# Patient Record
Sex: Female | Born: 1945 | Race: Black or African American | Hispanic: No
Health system: Southern US, Community
[De-identification: ages and names within clinical notes are randomized; demographics above are authoritative.]

## PROBLEM LIST (undated history)

## (undated) DIAGNOSIS — E7800 Pure hypercholesterolemia, unspecified: Secondary | ICD-10-CM

## (undated) DIAGNOSIS — I10 Essential (primary) hypertension: Secondary | ICD-10-CM

## (undated) DIAGNOSIS — I219 Acute myocardial infarction, unspecified: Secondary | ICD-10-CM

---

## 2001-05-18 ENCOUNTER — Encounter: Payer: Self-pay | Admitting: Family Medicine

## 2001-05-18 ENCOUNTER — Encounter: Admission: RE | Admit: 2001-05-18 | Discharge: 2001-05-18 | Payer: Self-pay | Admitting: Family Medicine

## 2001-06-15 ENCOUNTER — Inpatient Hospital Stay (HOSPITAL_COMMUNITY): Admission: EM | Admit: 2001-06-15 | Discharge: 2001-06-21 | Payer: Self-pay | Admitting: *Deleted

## 2001-07-06 ENCOUNTER — Encounter (HOSPITAL_COMMUNITY): Admission: RE | Admit: 2001-07-06 | Discharge: 2001-10-04 | Payer: Self-pay | Admitting: Cardiology

## 2001-07-21 ENCOUNTER — Emergency Department (HOSPITAL_COMMUNITY): Admission: EM | Admit: 2001-07-21 | Discharge: 2001-07-21 | Payer: Self-pay | Admitting: Emergency Medicine

## 2001-07-23 ENCOUNTER — Ambulatory Visit (HOSPITAL_COMMUNITY): Admission: RE | Admit: 2001-07-23 | Discharge: 2001-07-23 | Payer: Self-pay | Admitting: Cardiology

## 2001-07-30 ENCOUNTER — Ambulatory Visit (HOSPITAL_COMMUNITY): Admission: RE | Admit: 2001-07-30 | Discharge: 2001-07-30 | Payer: Self-pay | Admitting: Cardiology

## 2001-07-30 ENCOUNTER — Encounter: Payer: Self-pay | Admitting: Cardiology

## 2001-10-05 ENCOUNTER — Encounter (HOSPITAL_COMMUNITY): Admission: RE | Admit: 2001-10-05 | Discharge: 2001-10-20 | Payer: Self-pay | Admitting: Cardiology

## 2003-01-18 ENCOUNTER — Ambulatory Visit (HOSPITAL_COMMUNITY): Admission: RE | Admit: 2003-01-18 | Discharge: 2003-01-18 | Payer: Self-pay | Admitting: Cardiology

## 2003-01-18 ENCOUNTER — Encounter: Payer: Self-pay | Admitting: Cardiology

## 2003-12-08 ENCOUNTER — Ambulatory Visit (HOSPITAL_COMMUNITY): Admission: RE | Admit: 2003-12-08 | Discharge: 2003-12-08 | Payer: Self-pay | Admitting: Cardiology

## 2008-09-20 ENCOUNTER — Ambulatory Visit (HOSPITAL_COMMUNITY): Admission: RE | Admit: 2008-09-20 | Discharge: 2008-09-20 | Payer: Self-pay | Admitting: Obstetrics

## 2010-11-29 NOTE — Cardiovascular Report (Signed)
[place]  Patient:    Heather Larsen, Heather Larsen Visit Number: [id number] MRN: [id number]          Service Type: MED Location: 2000 2024 01 Attending Physician:  Robynn Pane Dictated by:   Eduardo Osier Sharyn Lull, M.D. Proc. Date: 06/15/01 Admit Date:  06/15/2001                          Cardiac Catheterization  PROCEDURES: 1. Left and right cardiac catheterization with selective right and left    coronary angiography, left ventriculography via right groin ______ . 2. Successful percutaneous transluminal coronary angioplasty to mid    left anterior descending using 2.5 x 13.5 mm, long CrossSail balloon. 3. Successful deployment of 2.5 x 13.3 mm, long Bx Velocity stent in the mid    left anterior descending.  INDICATIONS FOR PROCEDURE: The patient is a 65 year old, white female, ______ Dictated by:   Eduardo Osier. Sharyn Lull, M.D. Attending Physician:  Robynn Pane DD:  06/21/01 TD:  06/21/01 Job: [id number]

## 2010-11-29 NOTE — Cardiovascular Report (Signed)
[place]. [facility]  Patient:    Heather Larsen, Heather Larsen Visit Number: [id number] MRN: [id number]          Service Type: EMS Location: [place] Attending Physician:  Nelia Shi Dictated by:   Eduardo Osier Sharyn Lull, M.D. Proc. Date: 09/22/01 Admit Date:  07/21/2001 Discharge Date: 07/21/2001   CC:         Cath Lab   Cardiac Catheterization  PROCEDURES PERFORMED: 1. Left ventriculography via groin using Judkins technique. 2. Selective left and right coronary angiography. 3. Successful percutaneous transluminal coronary angioplasty to mid    left anterior descending coronary artery done using 2.5 x 15 mm    long CrossSail balloon. 4. Successful deployment of 2.5 x 13 mm long BX Velocity stent    deployed in mid left anterior descending coronary artery.  INDICATIONS:  The patient is a 65 year old black female with past medical history significant for hypertension, history of GERD, lactose intolerance, allergic rhinitis. She same to the ER complaining of retrosternal chest pressures since 7:00 a.m. after eating breakfast associated with nausea and vomiting x4 and mild shortness of breath. The patient waited all day and thought it was acid reflux and then called PMD in afternoon and was advised to go to ER. States pain was grade 5/10 and now 3/10. EKG done in the ER showed Q-waves in V1-V3 with ST elevation and ST-T wave changes in anterolateral leads suggestive of evolving anteroseptal wall MI. The patient denies prior episodes of such pain. Denies PND, orthopnea, leg swelling, denies palpitations, lightheadedness or syncope.  PAST MEDICAL HISTORY:  As above.  PAST SURGICAL HISTORY:  She had ovarian cyst resection about 27 years ago.  HOME MEDICATIONS: 1. Adalat CC 60 mg p.o.q.d. 2. Claritin. 3. Prempro.  ALLERGIES:  No known drug allergies.  SOCIAL HISTORY:  She is widowed, has one son. No history of smoking or alcohol abuse. Works for [employer] and  born in [place].  FAMILY HISTORY:  Father died of MI at the age of 18, mother died of lung cancer at age of 75.  PHYSICAL EXAMINATION:  VITAL SIGNS:  On exam she was awake, alert and oriented times three. Blood pressure was 150/84, pulse was 65.  HEENT:  Conjunctivae was pink.  NECK:  Supple, no JVD, no bruit.  LUNGS:  Clear to auscultation without rhonchi or rails.  CARDIOVASCULAR:  S1 and S2 was normal. There was a soft S4 gallop.  ABDOMEN:  Soft, bowel sounds are present, nontender.  EXTREMITIES:  There is no cyanosis, clubbing, or edema.  LABORATORY AND ACCESSORY DATA:  EKG showed normal sinus rhythm with evolving anteroseptal wall MI. Her first set of CPK was 84, MB of 172.  INDICATIONS:  Discussed with patient regarding possible emergency left catheterization, possible PTCA and stenting, and its risks, i.e., MI, stroke, need for emergency CABG, risk of restenosis, heart failure, arrhythmias, local anesthetic complications, etc. and patient consented for PCI.  DESCRIPTION OF PROCEDURE:  After obtaining informed consent, the patient was brought to the Cath Lab and was placed on fluoroscopy table. The right groin was prepped and draped in usual fashion. Xylocaine 2% was used for local anesthesia in the right groin. With the help of thin walled needle, a #7 [place] arterial and #6 French venous sheaths are placed. Both the sheath are spreaded and flushed. Next, a #6 [place] left Judkins catheter was advanced over the wire under fluoroscopic guidance up to the ascending aorta, the wire was pulled out, the catheter  was aspirated and connected to the manifold. The catheter was further advanced and engaged into the left coronary ostium. Multiple views of the left system were taken. Next, the catheter was disengaged and was pulled out over the wire and was replaced with #6 [place] right Judkins catheter which was advanced over the wire under fluoroscopic guidance up to the  ascending aorta. The wire was pulled out, the catheter was aspirated and connected to the manifold. The catheter was further advanced and engaged into the right coronary ostium. Multiple views of the right system were taken. Next, the catheter was disengaged and was pulled out over the wire and was replaced with #6 French pigtail catheter which was advanced over the wire under fluoroscopic guidance up to the ascending aorta. The wire was pulled out, the catheter was aspirated and connected to the manifold. The catheter was further advanced across the aortic valve into the left ventricle. Left ventricular pressures were recorded. Next, angiography was done in 30 degree RAO position, postangiographic pressures was recorded from the left ventricle and then pulled back and pressures were recorded from the aorta. There was no gradient across the aortic valve. Next, the pigtail catheter was pulled out over the wire, the sheaths were aspirated and flushed.  FINDINGS:  CORONARY ANGIOGRAPHY:  Severe anterolateral wall hypokinesia. Ejection fraction of 45-50%. There was 2+ MR. The left main was patent, the LAD was 100% occluded proximally after giving off large diagonal 2; diagonal 1 was very small which was patent. The circumflex was small which tapers down in AV groove after giving off OM2; OM11 was medium sized which was patent, OM2 was very small which was patent. The RCA was patent.  INTERVENTIONAL PROCEDURES:  Successful PTCA to 100% occluded mid LAD done using 2.5 x 15 mm long CrossSail balloon for predilation and then 2.5 x 13 mm long BX Velocity stent was deployed at 10 atmospheres of pressure which was fully expanded going up to 13 atmospheres of pressures. The lesion was dilated from 100% to 0% residual with excellent TIMI grade III distal flow without evidence of dissection or distal embolization.  The patient received intermittent heparin and Reopro during the procedure.  The  patient received aspirin and 300 mg of Plavix in ER. The patient tolerated the procedure well. There were no complications. The patient was transferred to CCU in stable condition. Dictated by:   Eduardo Osier Sharyn Lull, M.D. Attending Physician:  Nelia Shi DD:  09/23/01 TD:  09/24/01 Job: [id number]

## 2010-11-29 NOTE — Discharge Summary (Signed)
. [facility]  Patient:    Heather Larsen, Heather Larsen Visit Number: [id number] MRN: [id number]          Service Type: EMS Location: [place] Attending Physician:  Nelia Shi Dictated by:   Eduardo Osier Sharyn Lull, M.D. Admit Date:  07/21/2001 Discharge Date: 07/21/2001                             Discharge Summary  ADMISSION DIAGNOSES: 1. Evolving anteroseptal myocardial infarction. 2. Hypertension. 3. Gastroesophageal reflux disease. 4. History of allergic rhinitis.  FINAL DIAGNOSES: 1. Status post anteroseptal myocardial infarction status post percutaneous    transluminal coronary angioplasty and stenting to proximal left anterior    descending artery. 2. Hypertension. 3. Gastroesophageal reflux disease. 4. History of allergic rhinitis. 5. Anemia secondary to hydration and blood loss during the procedure. 6. Status post nonsustained ventricular tachycardia.  DISCHARGE MEDICATIONS: 1. Enteric-coated aspirin 325 mg 1 tablet daily. 2. Plavix 75 mg 1 tablet daily with food. 3. Toprol XL 25 mg 1 tablet daily. 4. Altace 2.5 mg 1 capsule daily. 5. Nitrostat 0.4 mg sublingually, use as directed.  ACTIVITY:  Avoid heavy lifting, pushing, or pulling.  DIET:  Low salt, low cholesterol.  DISCHARGE INSTRUCTIONS:  Angioplasty and stent instructions have been given. The patient will be scheduled for phase 2 cardiac rehabilitation as outpatient.  Follow up with me in one week.  CONDITION UPON DISCHARGE: Stable.  BRIEF HISTORY:  Heather Larsen is a 65 year old black female with past medical history significant for hypertension, history of GERD, lactose intolerance, history of allergic rhinitis.  She came to the ER complaining of retrosternal chest pressure since 7 a.m. after eating breakfast, associated with nausea and vomiting x 4 and mild shortness of breath.  She waited all day long, thought it was due to acid reflux, and then called M.D. in the  afternoon and was advised to go to the ER.  The patient states chest pain was grade 5/10 and now 3/10.  EKG done in the ER showed Q waves in V1 through V3 with ST elevation and ST-T wave changes in anterolateral leads suggestive of evolving anteroseptal MI. The patient denies prior episodes of chest pain.  Denies PND, orthopnea, leg swelling.  Denies palpitations, lightheadedness, or syncope.  PAST MEDICAL HISTORY:  As above.  PAST SURGICAL HISTORY:  She had ovarian cyst resection approximately 27 years ago.  HOME MEDICATIONS: 1. Adalat CC 60 mg p.o. q.d. 2. Claritin.  ALLERGIES:  No known drug allergies.  SOCIAL HISTORY:  She is widowed, has one son.  No history of smoking or alcohol abuse.  She works for [employer], born in [place].  FAMILY HISTORY:  Father died of MI at the age of 63.  Mother died of lung cancer at the age of 36.  One brother is in good health.  PHYSICAL EXAMINATION:  GENERAL:  Alert, awake, oriented x 3, in no acute distress.  VITAL SIGNS:  Blood pressure 150/84, pulse 65.  HEENT:  Conjunctivae pink.  NECK:  Supple.  No JVD, no bruits.  LUNGS:  Clear to auscultation without rhonchi or rales.  CARDIOVASCULAR:  S1 and S2 normal.  There was a soft S4 gallop.  ABDOMEN:  Soft.  Bowel sounds were present.  Nontender.  EXTREMITIES:  No clubbing, cyanosis, or edema.  DIAGNOSTIC DATA:  EKG showed normal sinus rhythm with evolving anteroseptal wall MI.  Her first CPK was already elevated.  CK was 948, MB 172.6, relative index 18.2.  Troponin I was also elevated at 4.32.  Second set showed CPK was 2162, MB 345.9, relative index 16.  Fourth set CPK was 1990, MB 313, relative index 15.8.  Next set CPK 287, MB 3.6, relative index 1.3.  Troponin I second set was 41.43, third set 30.18, fourth set 10.92.  Her cholesterol was 155, LDL 86, HDL 52.  Sodium 134, potassium 3.0, chloride 111, bicarb 31, BUN 5, creatinine 0.6, glucose 94.  Electrolytes on  December 5 showed sodium 143, potassium 3.6, chloride 113, bicarb 25, glucose 87, BUN 8, creatinine 1.1. Hemoglobin 13.7, hematocrit 39.6, white count 7.5 on December 6.  On December 5, hemoglobin was 11.2, hematocrit 32.1.  On December 6, hemoglobin was 10.5, hematocrit 30.9 which was stable.  Chest x-ray showed borderline cardiomegaly and tortuous aorta.  No active disease.  HOSPITAL COURSE:  The patient underwent left cardiac catheterization and selective left and right coronary angiography, left ventriculography, and PTCA and stenting to 100% occluded mid LAD.  The procedure worked.  The patient tolerated the procedure well and was transferred to CCU.  The patient had a brief episode of nonsustained ventricular tachycardia during the hospital stay and was started on IV amiodarone for 24 hours.  The patient did not have any further episodes of ventricular tachycardia during the hospital stay.  The sheaths were taken out same day.  There was no evidence of hematoma or bruit. The patient has been ambulating in the hallway without any problems.  The patient did not have any episodes of chest pain during the hospital stay. Phase 1 cardiac rehabilitation was called.  The patient ambulated multiple times without any problems.  Discussion was held with cardiac rehabilitation. The patient states she works two jobs, and she was unsure about phase 2 rehabilitation.  Will discuss with patient regarding phase 2 rehabilitation as outpatient.  If she agrees, will schedule for phase 2 cardiac rehabilitation as outpatient. Dictated by:   Eduardo Osier Sharyn Lull, M.D. Attending Physician:  Nelia Shi DD:  09/23/01 TD:  09/24/01 Job: [id number]

## 2010-11-29 NOTE — Discharge Summary (Signed)
[place]. [facility]  Patient:    Heather Larsen, Heather Larsen Visit Number: [id number] MRN: [id number]          Service Type: MED Location: 2000 2024 01 Attending Physician:  Robynn Pane Dictated by:   Eduardo Osier Sharyn Lull, M.D. Admit Date:  06/15/2001                             Discharge Summary  Inaudible report. Dictated by:   Eduardo Osier Sharyn Lull, M.D. Attending Physician:  Robynn Pane DD:  06/21/01 TD:  06/21/01 Job: [id number]

## 2010-12-20 ENCOUNTER — Encounter: Payer: Self-pay | Admitting: Gastroenterology

## 2013-09-30 ENCOUNTER — Encounter: Payer: Self-pay | Admitting: Gastroenterology

## 2017-11-24 ENCOUNTER — Other Ambulatory Visit: Payer: Self-pay | Admitting: Cardiology

## 2017-11-24 DIAGNOSIS — R079 Chest pain, unspecified: Secondary | ICD-10-CM

## 2017-12-04 ENCOUNTER — Encounter (HOSPITAL_COMMUNITY): Payer: Self-pay | Admitting: Radiology

## 2017-12-04 ENCOUNTER — Ambulatory Visit (HOSPITAL_COMMUNITY)
Admission: RE | Admit: 2017-12-04 | Discharge: 2017-12-04 | Disposition: A | Discharge status: Home / Self Care | Payer: Medicare Other | Source: Ambulatory Visit | Attending: Cardiology | Admitting: Cardiology

## 2017-12-04 DIAGNOSIS — I252 Old myocardial infarction: Secondary | ICD-10-CM | POA: Insufficient documentation

## 2017-12-04 DIAGNOSIS — R079 Chest pain, unspecified: Secondary | ICD-10-CM | POA: Diagnosis present

## 2017-12-04 HISTORY — DX: Pure hypercholesterolemia, unspecified: E78.00

## 2017-12-04 HISTORY — DX: Essential (primary) hypertension: I10

## 2017-12-04 HISTORY — DX: Acute myocardial infarction, unspecified: I21.9

## 2017-12-04 MED ORDER — TECHNETIUM TC 99M TETROFOSMIN IV KIT
10.0000 | PACK | Freq: Once | INTRAVENOUS | Status: AC | PRN
  Administered 2017-12-04: 10 via INTRAVENOUS

## 2017-12-04 MED ORDER — REGADENOSON 0.4 MG/5ML IV SOLN
INTRAVENOUS | Status: AC
  Administered 2017-12-04: 0.4 mg via INTRAVENOUS
  Filled 2017-12-04: qty 5

## 2017-12-04 MED ORDER — REGADENOSON 0.4 MG/5ML IV SOLN
0.4000 mg | Freq: Once | INTRAVENOUS | Status: AC
  Administered 2017-12-04: 0.4 mg via INTRAVENOUS

## 2017-12-04 MED ORDER — TECHNETIUM TC 99M TETROFOSMIN IV KIT
30.0000 | PACK | Freq: Once | INTRAVENOUS | Status: AC | PRN
  Administered 2017-12-04: 30 via INTRAVENOUS

## 2017-12-15 ENCOUNTER — Ambulatory Visit (HOSPITAL_COMMUNITY)
Admission: RE | Admit: 2017-12-15 | Discharge: 2017-12-15 | Disposition: A | Discharge status: Home / Self Care | Payer: Medicare Other | Source: Ambulatory Visit | Attending: Cardiology | Admitting: Cardiology

## 2017-12-15 ENCOUNTER — Ambulatory Visit (HOSPITAL_COMMUNITY)
Admission: RE | Disposition: A | Discharge status: Home / Self Care | Payer: Self-pay | Source: Ambulatory Visit | Attending: Cardiology

## 2017-12-15 ENCOUNTER — Encounter (HOSPITAL_COMMUNITY): Payer: Self-pay | Admitting: Cardiology

## 2017-12-15 DIAGNOSIS — E669 Obesity, unspecified: Secondary | ICD-10-CM | POA: Diagnosis not present

## 2017-12-15 DIAGNOSIS — I25119 Atherosclerotic heart disease of native coronary artery with unspecified angina pectoris: Secondary | ICD-10-CM | POA: Insufficient documentation

## 2017-12-15 DIAGNOSIS — T82855A Stenosis of coronary artery stent, initial encounter: Secondary | ICD-10-CM | POA: Diagnosis not present

## 2017-12-15 DIAGNOSIS — Y831 Surgical operation with implant of artificial internal device as the cause of abnormal reaction of the patient, or of later complication, without mention of misadventure at the time of the procedure: Secondary | ICD-10-CM | POA: Insufficient documentation

## 2017-12-15 DIAGNOSIS — E785 Hyperlipidemia, unspecified: Secondary | ICD-10-CM | POA: Insufficient documentation

## 2017-12-15 DIAGNOSIS — Z7982 Long term (current) use of aspirin: Secondary | ICD-10-CM | POA: Insufficient documentation

## 2017-12-15 DIAGNOSIS — Z955 Presence of coronary angioplasty implant and graft: Secondary | ICD-10-CM | POA: Insufficient documentation

## 2017-12-15 DIAGNOSIS — R9439 Abnormal result of other cardiovascular function study: Secondary | ICD-10-CM | POA: Insufficient documentation

## 2017-12-15 DIAGNOSIS — Z6830 Body mass index (BMI) 30.0-30.9, adult: Secondary | ICD-10-CM | POA: Diagnosis not present

## 2017-12-15 DIAGNOSIS — I10 Essential (primary) hypertension: Secondary | ICD-10-CM | POA: Diagnosis not present

## 2017-12-15 DIAGNOSIS — Z79899 Other long term (current) drug therapy: Secondary | ICD-10-CM | POA: Diagnosis not present

## 2017-12-15 DIAGNOSIS — I209 Angina pectoris, unspecified: Secondary | ICD-10-CM | POA: Diagnosis present

## 2017-12-15 HISTORY — PX: INTRAVASCULAR PRESSURE WIRE/FFR STUDY: CATH118243

## 2017-12-15 HISTORY — PX: LEFT HEART CATH AND CORONARY ANGIOGRAPHY: CATH118249

## 2017-12-15 LAB — POCT ACTIVATED CLOTTING TIME
ACTIVATED CLOTTING TIME: 433 s
Activated Clotting Time: 158 seconds

## 2017-12-15 LAB — NO BLOOD PRODUCTS

## 2017-12-15 SURGERY — LEFT HEART CATH AND CORONARY ANGIOGRAPHY
Anesthesia: LOCAL

## 2017-12-15 MED ORDER — LIDOCAINE HCL (PF) 1 % IJ SOLN
INTRAMUSCULAR | Status: DC | PRN
  Administered 2017-12-15: 18 mL

## 2017-12-15 MED ORDER — IOPAMIDOL (ISOVUE-370) INJECTION 76%
INTRAVENOUS | Status: AC
  Filled 2017-12-15: qty 50

## 2017-12-15 MED ORDER — SODIUM CHLORIDE 0.9 % WEIGHT BASED INFUSION: 1.0000 mL/kg/h | INTRAVENOUS | Status: DC

## 2017-12-15 MED ORDER — SODIUM CHLORIDE 0.9 % IV SOLN
INTRAVENOUS | Status: AC | PRN
  Administered 2017-12-15: 250 mL via INTRAVENOUS

## 2017-12-15 MED ORDER — ONDANSETRON HCL 4 MG/2ML IJ SOLN: 4.0000 mg | Freq: Four times a day (QID) | INTRAMUSCULAR | Status: DC | PRN

## 2017-12-15 MED ORDER — SODIUM CHLORIDE 0.9 % IV SOLN: 250.0000 mL | INTRAVENOUS | Status: DC | PRN

## 2017-12-15 MED ORDER — SODIUM CHLORIDE 0.9% FLUSH: 3.0000 mL | Freq: Two times a day (BID) | INTRAVENOUS | Status: DC

## 2017-12-15 MED ORDER — FENTANYL CITRATE (PF) 100 MCG/2ML IJ SOLN
INTRAMUSCULAR | Status: DC | PRN
  Administered 2017-12-15: 25 ug via INTRAVENOUS

## 2017-12-15 MED ORDER — ADENOSINE 12 MG/4ML IV SOLN
INTRAVENOUS | Status: AC
  Filled 2017-12-15: qty 16

## 2017-12-15 MED ORDER — ADENOSINE (DIAGNOSTIC) 140MCG/KG/MIN
INTRAVENOUS | Status: DC | PRN
  Administered 2017-12-15: 140 ug/kg/min via INTRAVENOUS

## 2017-12-15 MED ORDER — NITROGLYCERIN 1 MG/10 ML FOR IR/CATH LAB
INTRA_ARTERIAL | Status: DC | PRN
  Administered 2017-12-15: 100 ug via INTRACORONARY

## 2017-12-15 MED ORDER — CLOPIDOGREL BISULFATE 75 MG PO TABS
75.0000 mg | ORAL_TABLET | Freq: Once | ORAL | Status: AC
Start: 2017-12-15 — End: 2017-12-15
  Administered 2017-12-15: 75 mg via ORAL

## 2017-12-15 MED ORDER — IOPAMIDOL (ISOVUE-370) INJECTION 76%
INTRAVENOUS | Status: AC
  Filled 2017-12-15: qty 100

## 2017-12-15 MED ORDER — FENTANYL CITRATE (PF) 100 MCG/2ML IJ SOLN
INTRAMUSCULAR | Status: AC
  Filled 2017-12-15: qty 2

## 2017-12-15 MED ORDER — BIVALIRUDIN BOLUS VIA INFUSION - CUPID
INTRAVENOUS | Status: DC | PRN
  Administered 2017-12-15: 56.1 mg via INTRAVENOUS

## 2017-12-15 MED ORDER — ASPIRIN 81 MG PO CHEW
CHEWABLE_TABLET | ORAL | Status: AC
  Administered 2017-12-15: 81 mg via ORAL
  Filled 2017-12-15: qty 1

## 2017-12-15 MED ORDER — BIVALIRUDIN TRIFLUOROACETATE 250 MG IV SOLR
INTRAVENOUS | Status: DC | PRN
  Administered 2017-12-15: 1.75 mg/kg/h via INTRAVENOUS

## 2017-12-15 MED ORDER — BIVALIRUDIN TRIFLUOROACETATE 250 MG IV SOLR
INTRAVENOUS | Status: AC
  Filled 2017-12-15: qty 250

## 2017-12-15 MED ORDER — SODIUM CHLORIDE 0.9 % IV SOLN: INTRAVENOUS | Status: DC

## 2017-12-15 MED ORDER — IOPAMIDOL (ISOVUE-370) INJECTION 76%
INTRAVENOUS | Status: DC | PRN
  Administered 2017-12-15: 110 mL via INTRA_ARTERIAL

## 2017-12-15 MED ORDER — ASPIRIN 81 MG PO CHEW
81.0000 mg | CHEWABLE_TABLET | ORAL | Status: AC
  Administered 2017-12-15: 81 mg via ORAL

## 2017-12-15 MED ORDER — HEPARIN (PORCINE) IN NACL 2-0.9 UNITS/ML
INTRAMUSCULAR | Status: AC | PRN
  Administered 2017-12-15 (×2): 500 mL via INTRA_ARTERIAL

## 2017-12-15 MED ORDER — NITROGLYCERIN 1 MG/10 ML FOR IR/CATH LAB
INTRA_ARTERIAL | Status: AC
  Filled 2017-12-15: qty 10

## 2017-12-15 MED ORDER — MIDAZOLAM HCL 2 MG/2ML IJ SOLN
INTRAMUSCULAR | Status: DC | PRN
  Administered 2017-12-15: 1 mg via INTRAVENOUS

## 2017-12-15 MED ORDER — MIDAZOLAM HCL 2 MG/2ML IJ SOLN
INTRAMUSCULAR | Status: AC
Start: 2017-12-15 — End: ?
  Filled 2017-12-15: qty 2

## 2017-12-15 MED ORDER — SODIUM CHLORIDE 0.9 % WEIGHT BASED INFUSION
3.0000 mL/kg/h | INTRAVENOUS | Status: AC
  Administered 2017-12-15: 3 mL/kg/h via INTRAVENOUS

## 2017-12-15 MED ORDER — LIDOCAINE HCL (PF) 1 % IJ SOLN
INTRAMUSCULAR | Status: AC
  Filled 2017-12-15: qty 30

## 2017-12-15 MED ORDER — SODIUM CHLORIDE 0.9% FLUSH: 3.0000 mL | INTRAVENOUS | Status: DC | PRN

## 2017-12-15 MED ORDER — HEPARIN (PORCINE) IN NACL 1000-0.9 UT/500ML-% IV SOLN
INTRAVENOUS | Status: AC
  Filled 2017-12-15: qty 1000

## 2017-12-15 MED ORDER — CLOPIDOGREL BISULFATE 75 MG PO TABS
ORAL_TABLET | ORAL | Status: AC
  Administered 2017-12-15: 75 mg via ORAL
  Filled 2017-12-15: qty 1

## 2017-12-15 SURGICAL SUPPLY — 16 items
CATH INFINITI 5 FR JR3.5 (CATHETERS) ×1 IMPLANT
CATH INFINITI 5FR MULTPACK ANG (CATHETERS) ×1 IMPLANT
CATH VISTA GUIDE 6FR XB3 (CATHETERS) ×1 IMPLANT
GUIDEWIRE PRESSURE COMET II (WIRE) ×1 IMPLANT
KIT ESSENTIALS PG (KITS) ×1 IMPLANT
KIT HEART LEFT (KITS) ×2 IMPLANT
PACK CARDIAC CATHETERIZATION (CUSTOM PROCEDURE TRAY) ×2 IMPLANT
PROTECTION STATION PRESSURIZED (MISCELLANEOUS) ×2
SHEATH PINNACLE 5F 10CM (SHEATH) ×1 IMPLANT
SHEATH PINNACLE 6F 10CM (SHEATH) ×1 IMPLANT
STATION PROTECTION PRESSURIZED (MISCELLANEOUS) IMPLANT
SYR MEDRAD MARK V 150ML (SYRINGE) ×2 IMPLANT
TRANSDUCER W/STOPCOCK (MISCELLANEOUS) ×2 IMPLANT
TUBING CIL FLEX 10 FLL-RA (TUBING) ×1 IMPLANT
TUBING CONTRAST HIGH PRESS 20 (MISCELLANEOUS) ×1 IMPLANT
WIRE EMERALD 3MM-J .035X150CM (WIRE) ×1 IMPLANT

## 2017-12-15 NOTE — Progress Notes (Signed)
Pt ambulatory in hall tolerated well.  Right groin level zero.  Will d/c home

## 2017-12-15 NOTE — Discharge Instructions (Signed)

## 2017-12-15 NOTE — Interval H&P Note (Signed)
Cath Lab Visit (complete for each Cath Lab visit)  Clinical Evaluation Leading to the Procedure:   ACS: No.  Non-ACS:    Anginal Classification: CCS III  Anti-ischemic medical therapy: Maximal Therapy (2 or more classes of medications)  Non-Invasive Test Results: Intermediate-risk stress test findings: cardiac mortality 1-3%/year  Prior CABG: No previous CABG      History and Physical Interval Note:  12/15/2017 7:39 AM  Heather Larsen  has presented today for surgery, with the diagnosis of abnormal stress test - cp  The various methods of treatment have been discussed with the patient and family. After consideration of risks, benefits and other options for treatment, the patient has consented to  Procedure(s): LEFT HEART CATH AND CORONARY ANGIOGRAPHY (N/A) as a surgical intervention .  The patient's history has been reviewed, patient examined, no change in status, stable for surgery.  I have reviewed the patient's chart and labs.  Questions were answered to the patient's satisfaction.     Rinaldo CloudHarwani, Eduard Penkala

## 2017-12-15 NOTE — Progress Notes (Signed)
Site area: Right groin a 6 french arterial sheath was removed  Site Prior to Removal:  Level 0  Pressure Applied For 20 MINUTES     Beginning at 1115am  Manual:   Yes.    Patient Status During Pull:  stable  Post Pull Groin Site:  Level 0  Post Pull Instructions Given:  Yes.    Post Pull Pulses Present:  Yes.    Dressing Applied:  Yes.    Comments:  VS remain stable

## 2017-12-15 NOTE — H&P (Signed)
The printed H&P in the chart needs to be scanned 

## 2017-12-17 MED FILL — Heparin Sod (Porcine)-NaCl IV Soln 1000 Unit/500ML-0.9%: INTRAVENOUS | Qty: 1000 | Status: AC
